# Patient Record
Sex: Male | Born: 1935 | Race: White | Hispanic: No | Marital: Married | Smoking: Never smoker
Health system: Southern US, Community
[De-identification: ages and names within clinical notes are randomized; demographics above are authoritative.]

## PROBLEM LIST (undated history)

## (undated) DIAGNOSIS — I10 Essential (primary) hypertension: Secondary | ICD-10-CM

## (undated) DIAGNOSIS — K225 Diverticulum of esophagus, acquired: Secondary | ICD-10-CM

## (undated) DIAGNOSIS — N200 Calculus of kidney: Secondary | ICD-10-CM

## (undated) DIAGNOSIS — R011 Cardiac murmur, unspecified: Secondary | ICD-10-CM

## (undated) DIAGNOSIS — C9000 Multiple myeloma not having achieved remission: Secondary | ICD-10-CM

## (undated) DIAGNOSIS — K5900 Constipation, unspecified: Secondary | ICD-10-CM

## (undated) DIAGNOSIS — K219 Gastro-esophageal reflux disease without esophagitis: Secondary | ICD-10-CM

## (undated) DIAGNOSIS — H9190 Unspecified hearing loss, unspecified ear: Secondary | ICD-10-CM

## (undated) DIAGNOSIS — I219 Acute myocardial infarction, unspecified: Secondary | ICD-10-CM

## (undated) HISTORY — PX: CARDIAC CATHETERIZATION: SHX172

## (undated) HISTORY — PX: LITHOTRIPSY: SUR834

## (undated) HISTORY — PX: HERNIA REPAIR: SHX51

## (undated) HISTORY — PX: CYSTOSCOPY W/ URETERAL STENT PLACEMENT: SHX1429

## (undated) HISTORY — PX: COLONOSCOPY W/ POLYPECTOMY: SHX1380

## (undated) HISTORY — PX: CERVICAL FUSION: SHX112

---

## 2002-12-02 DIAGNOSIS — I219 Acute myocardial infarction, unspecified: Secondary | ICD-10-CM

## 2002-12-02 HISTORY — DX: Acute myocardial infarction, unspecified: I21.9

## 2004-12-29 ENCOUNTER — Inpatient Hospital Stay (HOSPITAL_COMMUNITY): Admission: RE | Admit: 2004-12-29 | Discharge: 2004-12-30 | Payer: Self-pay | Admitting: Neurosurgery

## 2005-01-01 HISTORY — PX: TRANSURETHRAL RESECTION OF PROSTATE: SHX73

## 2005-02-06 ENCOUNTER — Encounter: Admission: RE | Admit: 2005-02-06 | Discharge: 2005-02-06 | Payer: Self-pay | Admitting: Neurosurgery

## 2007-03-03 IMAGING — CR DG CHEST 2V
2 series · 2 of 2 positions shown · non-contrast
Comparison: None.

CLINICAL DATA: Pre-admit for HNP.
 CHEST - 2 VIEW:

[view not recorded (1 of 2)]
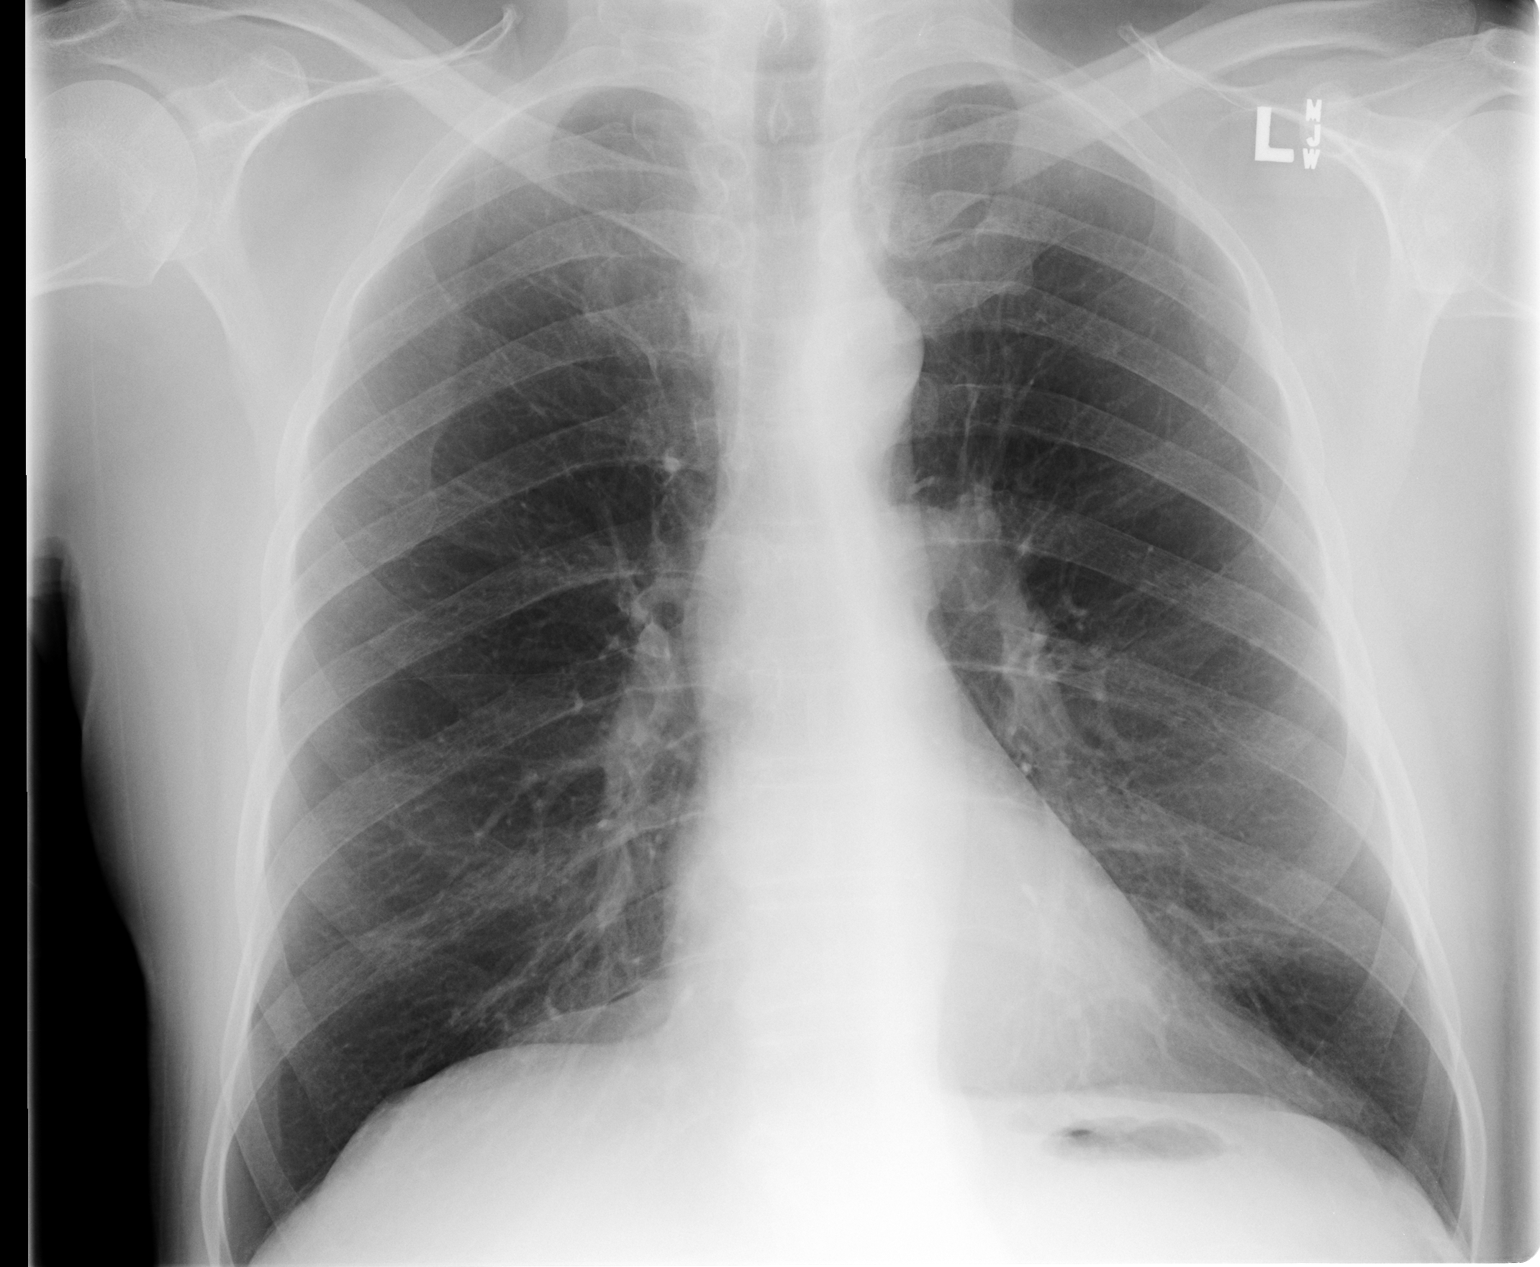

[view not recorded (2 of 2)]
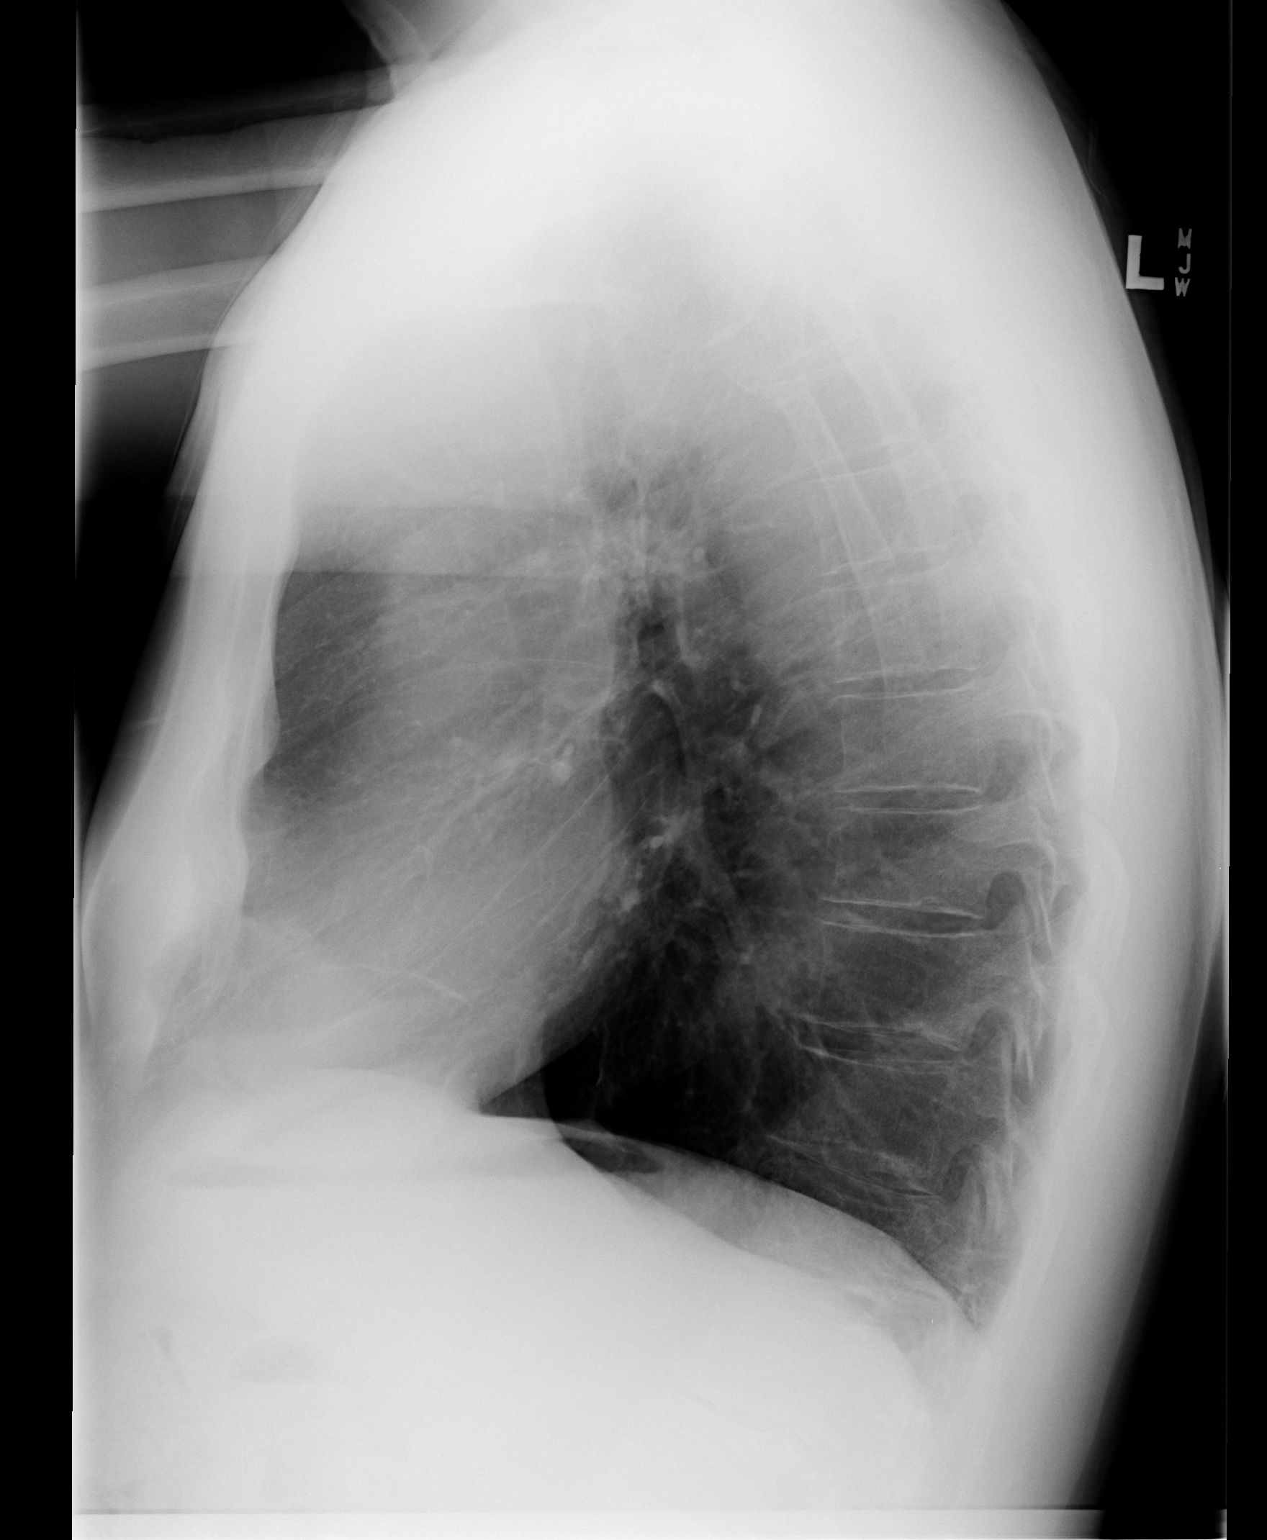

[2 of 2 positions shown; findings below may reference images not displayed]

FINDINGS: Midline trachea.  Heart size normal.  Mediastinal contours unremarkable.  There is a 3 to 5 mm density projecting between the 4th and 5th posterior left ribs.  There may be a second similar millimetric density over the posterior 7th left rib.  There is no pleural fluid.  The right lung is clear.
IMPRESSION: 1.  No acute cardiopulmonary disease.  
 2.  One and possible two tiny nodules over the left lung.  Given their well-circumscribed nature and their appearance on plain film, these most likely represent small granulomas.  These could be followed with plain film in 6 to 9 months to confirm stability if desired.

## 2008-08-09 HISTORY — PX: ESOPHAGOGASTRODUODENOSCOPY: SHX5428

## 2013-03-25 ENCOUNTER — Other Ambulatory Visit: Payer: Self-pay | Admitting: Neurosurgery

## 2013-04-06 ENCOUNTER — Encounter (HOSPITAL_COMMUNITY): Payer: Self-pay | Admitting: Pharmacy Technician

## 2013-04-10 ENCOUNTER — Encounter (HOSPITAL_COMMUNITY)
Admission: RE | Admit: 2013-04-10 | Discharge: 2013-04-10 | Disposition: A | Discharge status: Home / Self Care | Payer: Medicare Other | Source: Ambulatory Visit | Attending: Neurosurgery | Admitting: Neurosurgery

## 2013-04-10 ENCOUNTER — Other Ambulatory Visit: Payer: Self-pay | Admitting: Neurosurgery

## 2013-04-10 ENCOUNTER — Encounter (HOSPITAL_COMMUNITY): Payer: Self-pay

## 2013-04-10 DIAGNOSIS — Z01812 Encounter for preprocedural laboratory examination: Secondary | ICD-10-CM | POA: Insufficient documentation

## 2013-04-10 HISTORY — DX: Unspecified hearing loss, unspecified ear: H91.90

## 2013-04-10 HISTORY — DX: Calculus of kidney: N20.0

## 2013-04-10 HISTORY — DX: Multiple myeloma not having achieved remission: C90.00

## 2013-04-10 HISTORY — DX: Diverticulum of esophagus, acquired: K22.5

## 2013-04-10 HISTORY — DX: Cardiac murmur, unspecified: R01.1

## 2013-04-10 HISTORY — DX: Constipation, unspecified: K59.00

## 2013-04-10 HISTORY — DX: Essential (primary) hypertension: I10

## 2013-04-10 HISTORY — DX: Gastro-esophageal reflux disease without esophagitis: K21.9

## 2013-04-10 HISTORY — DX: Acute myocardial infarction, unspecified: I21.9

## 2013-04-10 LAB — CBC
HCT: 38.6 % — ABNORMAL LOW (ref 39.0–52.0)
Hemoglobin: 13.4 g/dL (ref 13.0–17.0)
MCH: 31.8 pg (ref 26.0–34.0)
MCHC: 34.7 g/dL (ref 30.0–36.0)
MCV: 91.5 fL (ref 78.0–100.0)
Platelets: 141 10*3/uL — ABNORMAL LOW (ref 150–400)
RBC: 4.22 MIL/uL (ref 4.22–5.81)
RDW: 12.3 % (ref 11.5–15.5)
WBC: 5.3 10*3/uL (ref 4.0–10.5)

## 2013-04-10 LAB — SURGICAL PCR SCREEN
MRSA, PCR: NEGATIVE
Staphylococcus aureus: NEGATIVE

## 2013-04-10 LAB — BASIC METABOLIC PANEL
BUN: 14 mg/dL (ref 6–23)
CO2: 22 mEq/L (ref 19–32)
Calcium: 9.3 mg/dL (ref 8.4–10.5)
Chloride: 101 mEq/L (ref 96–112)
Creatinine, Ser: 0.95 mg/dL (ref 0.50–1.35)
GFR calc Af Amer: 89 mL/min — ABNORMAL LOW (ref >90)
GFR, EST NON AFRICAN AMERICAN: 77 mL/min — AB (ref >90)
GLUCOSE: 125 mg/dL — AB (ref 70–99)
POTASSIUM: 4.2 meq/L (ref 3.7–5.3)
Sodium: 139 mEq/L (ref 137–147)

## 2013-04-10 NOTE — Pre-Procedure Instructions (Signed)
Mario Keith  04/10/2013   Your procedure is scheduled on:  Monday, April 20.  Report to [facility], Main Entrance [name]" at 9:55AM.  Call this number if you have problems the morning of surgery: [phone]   Remember:   Do not eat food or drink liquids after midnight  Sunday, April 19.   Take these medicines the morning of surgery with A SIP OF WATER: gabapentin (NEURONTIN), metoprolol succinate (TOPROL-XL), pantoprazole (PROTONIX).              May take Pain medication.               Stop taking Herbal medications and Vitamins, NSAIDS (Advil (Ibuprofen, Aleve (Naproxen), any aspirin or Ibuprofen products (BC or Goody Powders, Cold or sinus medications with Aspirin or Ibuprofen as an ingredient.    Do not wear jewelry, make-up or nail polish.  Do not wear lotions, powders, or perfumes             Men may shave face and neck.  Do not bring valuables to the hospital.  [facility] is not responsible for any belongings or valuables.               Contacts, dentures or bridgework may not be worn into surgery.  Leave suitcase in the car. After surgery it may be brought to your room.  For patients admitted to the hospital, discharge time is determined by your treatment team.                 Special Instructions: Review  [place] - Preparing For Surgery.   Please read over the following fact sheets that you were given: Pain Booklet, Coughing and Deep Breathing, Blood Transfusion Information and Surgical Site Infection Prevention

## 2013-04-10 NOTE — Progress Notes (Signed)
Patient instructed to  Stop Plavix 5 days prior to surgery, by Dr Saintclair Halsted.. I faxed a request to [facility] for a copy of sleep study.  I faxed a request to [facility] for a copy of echo.

## 2013-04-17 NOTE — Progress Notes (Signed)
Patient notified of new arrival time of 11:45 verbalized understanding

## 2013-04-20 ENCOUNTER — Encounter (HOSPITAL_COMMUNITY): Payer: Self-pay | Admitting: Certified Registered"

## 2013-04-20 ENCOUNTER — Inpatient Hospital Stay (HOSPITAL_COMMUNITY): Payer: Medicare Other

## 2013-04-20 ENCOUNTER — Inpatient Hospital Stay (HOSPITAL_COMMUNITY)
Admission: RE | Admit: 2013-04-20 | Discharge: 2013-04-22 | DRG: 473 | Disposition: A | Discharge status: Home / Self Care | Payer: Medicare Other | Source: Ambulatory Visit | Attending: Neurosurgery | Admitting: Neurosurgery

## 2013-04-20 ENCOUNTER — Inpatient Hospital Stay (HOSPITAL_COMMUNITY): Payer: Medicare Other | Admitting: Certified Registered"

## 2013-04-20 ENCOUNTER — Encounter (HOSPITAL_COMMUNITY): Payer: Medicare Other | Admitting: Certified Registered"

## 2013-04-20 ENCOUNTER — Encounter (HOSPITAL_COMMUNITY)
Admission: RE | Disposition: A | Discharge status: Home / Self Care | Payer: Self-pay | Source: Ambulatory Visit | Attending: Neurosurgery

## 2013-04-20 DIAGNOSIS — K219 Gastro-esophageal reflux disease without esophagitis: Secondary | ICD-10-CM | POA: Diagnosis present

## 2013-04-20 DIAGNOSIS — I252 Old myocardial infarction: Secondary | ICD-10-CM

## 2013-04-20 DIAGNOSIS — M4802 Spinal stenosis, cervical region: Secondary | ICD-10-CM | POA: Diagnosis present

## 2013-04-20 DIAGNOSIS — M47812 Spondylosis without myelopathy or radiculopathy, cervical region: Secondary | ICD-10-CM | POA: Diagnosis present

## 2013-04-20 DIAGNOSIS — Z7982 Long term (current) use of aspirin: Secondary | ICD-10-CM

## 2013-04-20 DIAGNOSIS — I2510 Atherosclerotic heart disease of native coronary artery without angina pectoris: Secondary | ICD-10-CM | POA: Diagnosis present

## 2013-04-20 DIAGNOSIS — Z79899 Other long term (current) drug therapy: Secondary | ICD-10-CM

## 2013-04-20 DIAGNOSIS — M4804 Spinal stenosis, thoracic region: Principal | ICD-10-CM | POA: Diagnosis present

## 2013-04-20 HISTORY — PX: POSTERIOR CERVICAL FUSION/FORAMINOTOMY: SHX5038

## 2013-04-20 LAB — TYPE AND SCREEN
ABO/RH(D): A POS
Antibody Screen: NEGATIVE

## 2013-04-20 LAB — ABO/RH: ABO/RH(D): A POS

## 2013-04-20 SURGERY — POSTERIOR CERVICAL FUSION/FORAMINOTOMY LEVEL 1
Anesthesia: General

## 2013-04-20 MED ORDER — ONDANSETRON HCL 4 MG/2ML IJ SOLN
INTRAMUSCULAR | Status: DC | PRN
Start: 2013-04-20 — End: 2013-04-20
  Administered 2013-04-20: 4 mg via INTRAVENOUS

## 2013-04-20 MED ORDER — ACETAMINOPHEN 650 MG RE SUPP: 650.0000 mg | RECTAL | Status: DC | PRN

## 2013-04-20 MED ORDER — MIDAZOLAM HCL 2 MG/2ML IJ SOLN
INTRAMUSCULAR | Status: AC
  Filled 2013-04-20: qty 2

## 2013-04-20 MED ORDER — PANTOPRAZOLE SODIUM 40 MG PO TBEC
40.0000 mg | DELAYED_RELEASE_TABLET | Freq: Two times a day (BID) | ORAL | Status: DC
  Administered 2013-04-20 – 2013-04-22 (×4): 40 mg via ORAL
  Filled 2013-04-20 (×3): qty 1

## 2013-04-20 MED ORDER — OXYMETAZOLINE HCL 0.05 % NA SOLN
1.0000 | Freq: Every day | NASAL | Status: DC
  Administered 2013-04-20 – 2013-04-21 (×2): 1 via NASAL
  Filled 2013-04-20: qty 15

## 2013-04-20 MED ORDER — THROMBIN 20000 UNITS EX SOLR
CUTANEOUS | Status: DC | PRN
  Administered 2013-04-20: 12:00:00 via TOPICAL

## 2013-04-20 MED ORDER — CLOPIDOGREL BISULFATE 75 MG PO TABS
75.0000 mg | ORAL_TABLET | Freq: Every day | ORAL | Status: DC
  Administered 2013-04-21 – 2013-04-22 (×2): 75 mg via ORAL
  Filled 2013-04-20 (×3): qty 1

## 2013-04-20 MED ORDER — ROCURONIUM BROMIDE 50 MG/5ML IV SOLN
INTRAVENOUS | Status: AC
  Filled 2013-04-20: qty 1

## 2013-04-20 MED ORDER — METOPROLOL SUCCINATE ER 100 MG PO TB24
100.0000 mg | ORAL_TABLET | Freq: Every day | ORAL | Status: DC
  Administered 2013-04-20 – 2013-04-22 (×3): 100 mg via ORAL
  Filled 2013-04-20 (×3): qty 1

## 2013-04-20 MED ORDER — PHENOL 1.4 % MT LIQD: 1.0000 | OROMUCOSAL | Status: DC | PRN

## 2013-04-20 MED ORDER — OXYCODONE HCL 5 MG PO TABS
5.0000 mg | ORAL_TABLET | Freq: Once | ORAL | Status: AC | PRN
  Administered 2013-04-20: 5 mg via ORAL

## 2013-04-20 MED ORDER — SODIUM CHLORIDE 0.9 % IV SOLN: 250.0000 mL | INTRAVENOUS | Status: DC

## 2013-04-20 MED ORDER — SODIUM CHLORIDE 0.9 % IJ SOLN
3.0000 mL | Freq: Two times a day (BID) | INTRAMUSCULAR | Status: DC
  Administered 2013-04-20: 3 mL via INTRAVENOUS

## 2013-04-20 MED ORDER — LACTATED RINGERS IV SOLN
INTRAVENOUS | Status: DC | PRN
  Administered 2013-04-20 (×2): via INTRAVENOUS

## 2013-04-20 MED ORDER — 0.9 % SODIUM CHLORIDE (POUR BTL) OPTIME
TOPICAL | Status: DC | PRN
  Administered 2013-04-20: 1000 mL

## 2013-04-20 MED ORDER — DEXAMETHASONE SODIUM PHOSPHATE 10 MG/ML IJ SOLN
INTRAMUSCULAR | Status: AC
  Filled 2013-04-20: qty 1

## 2013-04-20 MED ORDER — ADULT MULTIVITAMIN W/MINERALS CH
1.0000 | ORAL_TABLET | Freq: Every day | ORAL | Status: DC
  Administered 2013-04-21 – 2013-04-22 (×2): 1 via ORAL
  Filled 2013-04-20 (×2): qty 1

## 2013-04-20 MED ORDER — SODIUM CHLORIDE 0.9 % IR SOLN
Status: DC | PRN
  Administered 2013-04-20: 12:00:00

## 2013-04-20 MED ORDER — TRAMADOL HCL 50 MG PO TABS
50.0000 mg | ORAL_TABLET | Freq: Three times a day (TID) | ORAL | Status: DC
  Administered 2013-04-20 – 2013-04-22 (×4): 50 mg via ORAL
  Filled 2013-04-20 (×4): qty 1

## 2013-04-20 MED ORDER — SIMVASTATIN 40 MG PO TABS
40.0000 mg | ORAL_TABLET | Freq: Every day | ORAL | Status: DC
  Administered 2013-04-20 – 2013-04-22 (×3): 40 mg via ORAL
  Filled 2013-04-20 (×3): qty 1

## 2013-04-20 MED ORDER — SODIUM CHLORIDE 0.9 % IJ SOLN: 3.0000 mL | INTRAMUSCULAR | Status: DC | PRN

## 2013-04-20 MED ORDER — DOCUSATE SODIUM 100 MG PO CAPS
100.0000 mg | ORAL_CAPSULE | Freq: Two times a day (BID) | ORAL | Status: DC
  Administered 2013-04-20 – 2013-04-22 (×4): 100 mg via ORAL
  Filled 2013-04-20 (×5): qty 1

## 2013-04-20 MED ORDER — ACETAMINOPHEN 500 MG PO TABS
500.0000 mg | ORAL_TABLET | Freq: Three times a day (TID) | ORAL | Status: DC
  Administered 2013-04-20 – 2013-04-22 (×4): 500 mg via ORAL
  Filled 2013-04-20 (×7): qty 1

## 2013-04-20 MED ORDER — PROPOFOL 10 MG/ML IV BOLUS
INTRAVENOUS | Status: AC
  Filled 2013-04-20: qty 20

## 2013-04-20 MED ORDER — HYDROMORPHONE HCL PF 1 MG/ML IJ SOLN
INTRAMUSCULAR | Status: AC
  Filled 2013-04-20: qty 1

## 2013-04-20 MED ORDER — CEFAZOLIN SODIUM 1-5 GM-% IV SOLN
1.0000 g | Freq: Three times a day (TID) | INTRAVENOUS | Status: AC
  Administered 2013-04-20 – 2013-04-21 (×2): 1 g via INTRAVENOUS
  Filled 2013-04-20 (×2): qty 50

## 2013-04-20 MED ORDER — GLYCOPYRROLATE 0.2 MG/ML IJ SOLN
INTRAMUSCULAR | Status: DC | PRN
  Administered 2013-04-20: 0.2 mg via INTRAVENOUS
  Administered 2013-04-20: .8 mg via INTRAVENOUS

## 2013-04-20 MED ORDER — GABAPENTIN 600 MG PO TABS
600.0000 mg | ORAL_TABLET | Freq: Three times a day (TID) | ORAL | Status: DC
  Administered 2013-04-20 – 2013-04-22 (×6): 600 mg via ORAL
  Filled 2013-04-20 (×8): qty 1

## 2013-04-20 MED ORDER — MENTHOL 3 MG MT LOZG
1.0000 | LOZENGE | OROMUCOSAL | Status: DC | PRN
  Filled 2013-04-20: qty 9

## 2013-04-20 MED ORDER — FENTANYL CITRATE 0.05 MG/ML IJ SOLN
INTRAMUSCULAR | Status: AC
  Filled 2013-04-20: qty 5

## 2013-04-20 MED ORDER — FENTANYL CITRATE 0.05 MG/ML IJ SOLN
INTRAMUSCULAR | Status: DC | PRN
  Administered 2013-04-20 (×2): 50 ug via INTRAVENOUS
  Administered 2013-04-20: 100 ug via INTRAVENOUS

## 2013-04-20 MED ORDER — OXYCODONE-ACETAMINOPHEN 5-325 MG PO TABS
1.0000 | ORAL_TABLET | ORAL | Status: DC | PRN
Start: 2013-04-20 — End: 2013-04-22
  Administered 2013-04-21: 1 via ORAL
  Administered 2013-04-22: 2 via ORAL
  Filled 2013-04-20: qty 1
  Filled 2013-04-20: qty 2

## 2013-04-20 MED ORDER — ASPIRIN EC 81 MG PO TBEC
81.0000 mg | DELAYED_RELEASE_TABLET | Freq: Every day | ORAL | Status: DC
  Administered 2013-04-20 – 2013-04-22 (×3): 81 mg via ORAL
  Filled 2013-04-20 (×3): qty 1

## 2013-04-20 MED ORDER — PHENYLEPHRINE HCL 10 MG/ML IJ SOLN
10.0000 mg | INTRAVENOUS | Status: DC | PRN
  Administered 2013-04-20: 15 ug/min via INTRAVENOUS

## 2013-04-20 MED ORDER — DEXAMETHASONE SODIUM PHOSPHATE 10 MG/ML IJ SOLN
INTRAMUSCULAR | Status: DC | PRN
  Administered 2013-04-20: 10 mg via INTRAVENOUS

## 2013-04-20 MED ORDER — MIDAZOLAM HCL 5 MG/5ML IJ SOLN
INTRAMUSCULAR | Status: DC | PRN
  Administered 2013-04-20: 1 mg via INTRAVENOUS

## 2013-04-20 MED ORDER — PROMETHAZINE HCL 25 MG/ML IJ SOLN: 6.2500 mg | INTRAMUSCULAR | Status: DC | PRN

## 2013-04-20 MED ORDER — OXYCODONE HCL 5 MG PO TABS
ORAL_TABLET | ORAL | Status: AC
  Filled 2013-04-20: qty 1

## 2013-04-20 MED ORDER — LACTATED RINGERS IV SOLN
INTRAVENOUS | Status: DC
  Administered 2013-04-20: 10:00:00 via INTRAVENOUS

## 2013-04-20 MED ORDER — NEOSTIGMINE METHYLSULFATE 1 MG/ML IJ SOLN
INTRAMUSCULAR | Status: DC | PRN
  Administered 2013-04-20: 5 mg via INTRAVENOUS

## 2013-04-20 MED ORDER — PROPOFOL 10 MG/ML IV BOLUS
INTRAVENOUS | Status: DC | PRN
  Administered 2013-04-20: 170 mg via INTRAVENOUS

## 2013-04-20 MED ORDER — OXYCODONE HCL 5 MG/5ML PO SOLN: 5.0000 mg | Freq: Once | ORAL | Status: AC | PRN

## 2013-04-20 MED ORDER — LIDOCAINE HCL (CARDIAC) 20 MG/ML IV SOLN
INTRAVENOUS | Status: DC | PRN
  Administered 2013-04-20: 80 mg via INTRAVENOUS

## 2013-04-20 MED ORDER — PHENYLEPHRINE 40 MCG/ML (10ML) SYRINGE FOR IV PUSH (FOR BLOOD PRESSURE SUPPORT)
PREFILLED_SYRINGE | INTRAVENOUS | Status: AC
  Filled 2013-04-20: qty 10

## 2013-04-20 MED ORDER — THROMBIN 5000 UNITS EX SOLR
OROMUCOSAL | Status: DC | PRN
  Administered 2013-04-20: 14:00:00 via TOPICAL

## 2013-04-20 MED ORDER — CYCLOBENZAPRINE HCL 10 MG PO TABS
ORAL_TABLET | ORAL | Status: AC
  Filled 2013-04-20: qty 1

## 2013-04-20 MED ORDER — ONDANSETRON HCL 4 MG/2ML IJ SOLN: 4.0000 mg | INTRAMUSCULAR | Status: DC | PRN

## 2013-04-20 MED ORDER — ACETAMINOPHEN 325 MG PO TABS
650.0000 mg | ORAL_TABLET | ORAL | Status: DC | PRN
  Administered 2013-04-21: 650 mg via ORAL
  Filled 2013-04-20: qty 2

## 2013-04-20 MED ORDER — LIDOCAINE HCL (CARDIAC) 20 MG/ML IV SOLN
INTRAVENOUS | Status: AC
  Filled 2013-04-20: qty 5

## 2013-04-20 MED ORDER — CEFAZOLIN SODIUM-DEXTROSE 2-3 GM-% IV SOLR
INTRAVENOUS | Status: AC
  Filled 2013-04-20: qty 50

## 2013-04-20 MED ORDER — CEFAZOLIN SODIUM-DEXTROSE 2-3 GM-% IV SOLR
INTRAVENOUS | Status: DC | PRN
  Administered 2013-04-20: 2 g via INTRAVENOUS

## 2013-04-20 MED ORDER — LIDOCAINE-EPINEPHRINE 1 %-1:100000 IJ SOLN
INTRAMUSCULAR | Status: DC | PRN
Start: 2013-04-20 — End: 2013-04-20
  Administered 2013-04-20: 5 mL

## 2013-04-20 MED ORDER — SUCCINYLCHOLINE CHLORIDE 20 MG/ML IJ SOLN
INTRAMUSCULAR | Status: AC
  Filled 2013-04-20: qty 1

## 2013-04-20 MED ORDER — CYCLOBENZAPRINE HCL 10 MG PO TABS
10.0000 mg | ORAL_TABLET | Freq: Three times a day (TID) | ORAL | Status: DC | PRN
Start: 2013-04-20 — End: 2013-04-22
  Administered 2013-04-20 – 2013-04-21 (×2): 10 mg via ORAL
  Filled 2013-04-20 (×2): qty 1

## 2013-04-20 MED ORDER — ARTIFICIAL TEARS OP OINT
TOPICAL_OINTMENT | OPHTHALMIC | Status: DC | PRN
  Administered 2013-04-20: 1 via OPHTHALMIC

## 2013-04-20 MED ORDER — HYDROMORPHONE HCL PF 1 MG/ML IJ SOLN
0.5000 mg | INTRAMUSCULAR | Status: DC | PRN
Start: 2013-04-20 — End: 2013-04-22

## 2013-04-20 MED ORDER — ROCURONIUM BROMIDE 100 MG/10ML IV SOLN
INTRAVENOUS | Status: DC | PRN
  Administered 2013-04-20 (×2): 10 mg via INTRAVENOUS
  Administered 2013-04-20: 50 mg via INTRAVENOUS

## 2013-04-20 MED ORDER — HYDROMORPHONE HCL PF 1 MG/ML IJ SOLN
0.2500 mg | INTRAMUSCULAR | Status: DC | PRN
  Administered 2013-04-20 (×4): 0.5 mg via INTRAVENOUS

## 2013-04-20 MED ORDER — POLYETHYLENE GLYCOL 3350 17 G PO PACK: 17.0000 g | PACK | Freq: Every day | ORAL | Status: DC | PRN

## 2013-04-20 SURGICAL SUPPLY — 74 items
BAG DECANTER FOR FLEXI CONT (MISCELLANEOUS) ×3 IMPLANT
BENZOIN TINCTURE PRP APPL 2/3 (GAUZE/BANDAGES/DRESSINGS) ×3 IMPLANT
BLADE SURG 11 STRL SS (BLADE) ×3 IMPLANT
BLADE SURG ROTATE 9660 (MISCELLANEOUS) ×3 IMPLANT
BUR MATCHSTICK NEURO 3.0 LAGG (BURR) ×3 IMPLANT
CANISTER SUCT 3000ML (MISCELLANEOUS) ×3 IMPLANT
CAP ELLIPSE LOCKING (Cap) ×6 IMPLANT
CLOSURE WOUND 1/2 X4 (GAUZE/BANDAGES/DRESSINGS) ×1
CONT SPEC 4OZ CLIKSEAL STRL BL (MISCELLANEOUS) ×3 IMPLANT
DECANTER SPIKE VIAL GLASS SM (MISCELLANEOUS) ×3 IMPLANT
DERMABOND ADVANCED (GAUZE/BANDAGES/DRESSINGS) ×2
DERMABOND ADVANCED .7 DNX12 (GAUZE/BANDAGES/DRESSINGS) ×1 IMPLANT
DRAPE C-ARM 42X72 X-RAY (DRAPES) ×9 IMPLANT
DRAPE LAPAROTOMY 100X72 PEDS (DRAPES) ×3 IMPLANT
DRAPE MICROSCOPE LEICA (MISCELLANEOUS) ×3 IMPLANT
DRAPE MICROSCOPE ZEISS OPMI (DRAPES) IMPLANT
DRAPE POUCH INSTRU U-SHP 10X18 (DRAPES) ×3 IMPLANT
DRAPE SURG 17X23 STRL (DRAPES) IMPLANT
DRSG OPSITE 4X5.5 SM (GAUZE/BANDAGES/DRESSINGS) ×3 IMPLANT
DRSG OPSITE POSTOP 4X6 (GAUZE/BANDAGES/DRESSINGS) ×3 IMPLANT
DURAPREP 26ML APPLICATOR (WOUND CARE) ×6 IMPLANT
ELECT REM PT RETURN 9FT ADLT (ELECTROSURGICAL) ×3
ELECTRODE REM PT RTRN 9FT ADLT (ELECTROSURGICAL) ×1 IMPLANT
EVACUATOR 1/8 PVC DRAIN (DRAIN) ×3 IMPLANT
GAUZE SPONGE 4X4 16PLY XRAY LF (GAUZE/BANDAGES/DRESSINGS) IMPLANT
GLOVE BIO SURGEON STRL SZ8 (GLOVE) ×6 IMPLANT
GLOVE BIOGEL PI IND STRL 7.5 (GLOVE) ×1 IMPLANT
GLOVE BIOGEL PI IND STRL 8 (GLOVE) ×1 IMPLANT
GLOVE BIOGEL PI INDICATOR 7.5 (GLOVE) ×2
GLOVE BIOGEL PI INDICATOR 8 (GLOVE) ×2
GLOVE ECLIPSE 7.5 STRL STRAW (GLOVE) ×6 IMPLANT
GLOVE ECLIPSE 8.5 STRL (GLOVE) ×3 IMPLANT
GLOVE EXAM NITRILE LRG STRL (GLOVE) IMPLANT
GLOVE EXAM NITRILE MD LF STRL (GLOVE) ×3 IMPLANT
GLOVE EXAM NITRILE XL STR (GLOVE) IMPLANT
GLOVE EXAM NITRILE XS STR PU (GLOVE) IMPLANT
GLOVE INDICATOR 8.5 STRL (GLOVE) ×6 IMPLANT
GLOVE SURG SS PI 7.0 STRL IVOR (GLOVE) ×6 IMPLANT
GOWN BRE IMP SLV AUR LG STRL (GOWN DISPOSABLE) IMPLANT
GOWN BRE IMP SLV AUR XL STRL (GOWN DISPOSABLE) IMPLANT
GOWN STRL REIN 2XL LVL4 (GOWN DISPOSABLE) IMPLANT
GOWN STRL REUS W/ TWL XL LVL3 (GOWN DISPOSABLE) ×3 IMPLANT
GOWN STRL REUS W/TWL 2XL LVL3 (GOWN DISPOSABLE) ×6 IMPLANT
GOWN STRL REUS W/TWL XL LVL3 (GOWN DISPOSABLE) ×6
HEMOSTAT POWDER SURGIFOAM 1G (HEMOSTASIS) ×3 IMPLANT
KIT BASIN OR (CUSTOM PROCEDURE TRAY) ×3 IMPLANT
KIT ROOM TURNOVER OR (KITS) ×3 IMPLANT
MARKER SKIN DUAL TIP RULER LAB (MISCELLANEOUS) ×3 IMPLANT
NEEDLE HYPO 25X1 1.5 SAFETY (NEEDLE) ×3 IMPLANT
NEEDLE SPNL 20GX3.5 QUINCKE YW (NEEDLE) ×3 IMPLANT
NS IRRIG 1000ML POUR BTL (IV SOLUTION) ×3 IMPLANT
PACK LAMINECTOMY NEURO (CUSTOM PROCEDURE TRAY) ×3 IMPLANT
PAD ARMBOARD 7.5X6 YLW CONV (MISCELLANEOUS) ×9 IMPLANT
PIN MAYFIELD SKULL DISP (PIN) ×3 IMPLANT
PUTTY BONE DBX 5CC MIX (Putty) ×3 IMPLANT
ROD SPINE POST 3.5X80 (Rod) ×3 IMPLANT
RUBBERBAND STERILE (MISCELLANEOUS) ×6 IMPLANT
SCREW 3.5X16 (Screw) ×3 IMPLANT
SCREW 3.5X18MM (Screw) ×3 IMPLANT
SPONGE GAUZE 4X4 12PLY (GAUZE/BANDAGES/DRESSINGS) ×3 IMPLANT
SPONGE LAP 4X18 X RAY DECT (DISPOSABLE) IMPLANT
SPONGE SURGIFOAM ABS GEL 100 (HEMOSTASIS) ×3 IMPLANT
STRIP BIOACTIVE 5CC 25X50X4MM (Miscellaneous) ×3 IMPLANT
STRIP CLOSURE SKIN 1/2X4 (GAUZE/BANDAGES/DRESSINGS) ×2 IMPLANT
SUT ETHILON 4 0 PS 2 18 (SUTURE) IMPLANT
SUT VIC AB 0 CT1 18XCR BRD8 (SUTURE) ×1 IMPLANT
SUT VIC AB 0 CT1 8-18 (SUTURE) ×2
SUT VIC AB 2-0 CT1 18 (SUTURE) ×3 IMPLANT
SUT VICRYL 4-0 PS2 18IN ABS (SUTURE) ×3 IMPLANT
SYR 20ML ECCENTRIC (SYRINGE) ×3 IMPLANT
TOWEL OR 17X24 6PK STRL BLUE (TOWEL DISPOSABLE) ×3 IMPLANT
TOWEL OR 17X26 10 PK STRL BLUE (TOWEL DISPOSABLE) ×3 IMPLANT
TRAY FOLEY CATH 14FRSI W/METER (CATHETERS) IMPLANT
WATER STERILE IRR 1000ML POUR (IV SOLUTION) ×3 IMPLANT

## 2013-04-20 NOTE — Anesthesia Preprocedure Evaluation (Addendum)
Anesthesia Evaluation  Patient identified by MRN, date of birth, ID band Patient awake    Reviewed: Allergy & Precautions, H&P , NPO status , Patient's Chart, lab work & pertinent test results, reviewed documented beta blocker date and time   Airway Mallampati: II TM Distance: >3 FB Neck ROM: Full    Dental  (+) Teeth Intact   Pulmonary  breath sounds clear to auscultation  Pulmonary exam normal       Cardiovascular hypertension, Pt. on medications and Pt. on home beta blockers + CAD, + Past MI and + Cardiac Stents Rhythm:Regular Rate:Normal     Neuro/Psych    GI/Hepatic GERD-  Medicated and Controlled,  Endo/Other    Renal/GU Renal disease     Musculoskeletal   Abdominal Normal abdominal exam  (+)   Peds  Hematology   Anesthesia Other Findings   Reproductive/Obstetrics                         Anesthesia Physical Anesthesia Plan  ASA: III  Anesthesia Plan: General   Post-op Pain Management:    Induction: Intravenous  Airway Management Planned: Oral ETT  Additional Equipment:   Intra-op Plan:   Post-operative Plan: Extubation in OR  Informed Consent: I have reviewed the patients History and Physical, chart, labs and discussed the procedure including the risks, benefits and alternatives for the proposed anesthesia with the patient or authorized representative who has indicated his/her understanding and acceptance.   Dental advisory given  Plan Discussed with: CRNA, Anesthesiologist and Surgeon  Anesthesia Plan Comments:         Anesthesia Quick Evaluation

## 2013-04-20 NOTE — Transfer of Care (Signed)
Immediate Anesthesia Transfer of Care Note  Patient: Mario Keith  Procedure(s) Performed: Procedure(s) with comments: POSTERIOR CERVICAL FUSION/FORAMINOTOMY/LAMINOTOMY/DECOMPRESSION Cervical Seven/Thoracic One (N/A) - POSTERIOR CERVICAL FUSION/FORAMINOTOMY/LAMINOTOMY/DECOMPRESSION Cervical Seven/Thoracic One  Patient Location: PACU  Anesthesia Type:General  Level of Consciousness: awake, alert  and oriented  Airway & Oxygen Therapy: Patient Spontanous Breathing and Patient connected to face mask oxygen  Post-op Assessment: Report given to PACU RN  Post vital signs: Reviewed and stable  Complications: No apparent anesthesia complications

## 2013-04-20 NOTE — H&P (Signed)
Mario Keith is an 80 y.o. male.   Chief Complaint: Neck pain left arm pain HPI: Patient was a 80 year old some is a progress worsening neck and left arm pain rating down the last 3 fingers of his left hand consistent with a C8 radiculopathy. He had intermittently some weakness in his hand intrinsics. He failed all forms of conservative treatment. Workup revealed spondylosis at C7-T1 with slight motion on flexion extension films with severe foraminal stenosis the nerve root. All patient's symptomatology is been unilateral. I recommended laminotomy discectomy at C 7-T1 with lateral mass or pedicle screw fixation from C7-T1. I have extensively reviewed the risks and benefits of the operation the patient as well as perioperative course expectations about alternatives of surgery and he understands and agrees to proceed forward.  Past Medical History  Diagnosis Date  . Myocardial infarction 12/04  . Hypertension   . Heart murmur     years ago- not now  . Kidney stone   . GERD (gastroesophageal reflux disease)   . Constipation   . HOH (hard of hearing)   . Multiple myeloma     has never had to have treatment  . Zenker diverticulum     small    Past Surgical History  Procedure Laterality Date  . Lithotripsy    . Hernia repair Left   . Cardiac catheterization    . Colonoscopy w/ polypectomy      none in recent past- polpectomy  . Cervical fusion      with Cadavar bone  . Transurethral resection of prostate  2007  . Esophagogastroduodenoscopy  08,09,10  . Cystoscopy w/ ureteral stent placement      History reviewed. No pertinent family history. Social History:  reports that he has never smoked. He does not have any smokeless tobacco history on file. He reports that he does not drink alcohol or use illicit drugs.  Allergies: No Known Allergies  Medications Prior to Admission  Medication Sig Dispense Refill  . acetaminophen (TYLENOL) 500 MG chewable tablet Chew 1,000 mg by mouth 3  (three) times daily.      Marland Kitchen aspirin EC 81 MG tablet Take 81 mg by mouth daily.      . B Complex-C-Folic Acid (FOLBEE PLUS PO) Take 1 tablet by mouth daily.      . clopidogrel (PLAVIX) 75 MG tablet Take 75 mg by mouth daily with breakfast.      . Coenzyme Q10 (CO Q 10) 100 MG CAPS Take 100 mg by mouth daily.      Marland Kitchen gabapentin (NEURONTIN) 600 MG tablet Take 600 mg by mouth 3 (three) times daily.      . Glucosamine-Chondroitin (GLUCOSAMINE CHONDR COMPLEX PO) Take 1 tablet by mouth daily.      . metoprolol succinate (TOPROL-XL) 100 MG 24 hr tablet Take 100 mg by mouth daily. Take with or immediately following a meal.      . Multiple Vitamin (MULTIVITAMIN WITH MINERALS) TABS tablet Take 1 tablet by mouth daily.      Marland Kitchen oxymetazoline (AFRIN) 0.05 % nasal spray Place 1 spray into both nostrils at bedtime.       . pantoprazole (PROTONIX) 40 MG tablet Take 40 mg by mouth 2 (two) times daily.      . polyethylene glycol (MIRALAX / GLYCOLAX) packet Take 17 g by mouth daily as needed.      . simvastatin (ZOCOR) 40 MG tablet Take 40 mg by mouth daily.      . traMADol (  ULTRAM) 50 MG tablet Take 50 mg by mouth 3 (three) times daily.        Results for orders placed during the hospital encounter of 04/20/13 (from the past 48 hour(s))  TYPE AND SCREEN     Status: None   Collection Time    04/20/13 10:00 AM      Result Value Ref Range   ABO/RH(D) A POS     Antibody Screen NEG     Sample Expiration 04/23/2013     No results found.  Review of Systems  Constitutional: Negative.   Eyes: Negative.   Respiratory: Negative.   Cardiovascular: Negative.   Gastrointestinal: Negative.   Genitourinary: Negative.   Musculoskeletal: Positive for myalgias and neck pain.  Skin: Negative.   Neurological: Positive for tingling and sensory change.  Endo/Heme/Allergies: Negative.   Psychiatric/Behavioral: Negative.     Blood pressure 194/84, pulse 56, temperature 98 F (36.7 C), temperature source Oral, resp. rate  18, height _0  (1.88 m), weight 82.101 kg (181 lb), SpO2 100.00%. Physical Exam  Constitutional: He is oriented to person, place, and time. He appears well-developed and well-nourished.  HENT:  Head: Normocephalic.  Eyes: Pupils are equal, round, and reactive to light.  Neck: Normal range of motion.  Respiratory: Effort normal.  GI: Soft.  Neurological: He is alert and oriented to person, place, and time. He has normal strength.  Reflex Scores:      Tricep reflexes are 2+ on the right side and 2+ on the left side.      Bicep reflexes are 2+ on the right side and 2+ on the left side.      Brachioradialis reflexes are 2+ on the right side and 2+ on the left side.      Patellar reflexes are 2+ on the right side and 2+ on the left side.      Achilles reflexes are 2+ on the right side and 2+ on the left side. Strength is 5 out of 5 in his deltoids, biceps, triceps wrist flexion, wrist extension, and intrinsics.  Skin: Skin is warm and dry.     Assessment/Plan 80 year old presents for a C8 foraminotomies C7-T1 discectomy and posterior cervical fixation C7-T1  Elaina Hoops 04/20/2013, 11:22 AM

## 2013-04-20 NOTE — Op Note (Signed)
PReoperative diagnosis: C8 radiculopathy from cervical spondylosis with stenosis and instability at C7-T1  Postoperative diagnosis: Same  Procedure: Posterior cervical laminectomy and foraminotomies in the left C7-T1 with microdissection of the C8 nerve root microscopic foraminotomies  #2 posterior cervical fusion with globus ellipse pedicle screws at C7 and T1 and with 16 mm screws C7 18 mm T1  #3 posterior lateral arthrodesis C7-T1 using local autograft mixed with DBX and Kinex  Surgeon: Dominica Severin Johnjoseph Rolfe  Assistant: Kristeen Miss  Anesthesia: Gen.  EBL: Minimal  History of present illness: Patient is a 80 year old gentleman has had progressive worsening neck and left arm pain rate in last 2 fingers of his left axis with C8 nerve root pattern workup revealed a slight subluxation C7-T1 with severe foraminal stenosis in part from dorsal facet arthropathy and seated disc and marked foraminal stenosis. Due to patient's preoperative imaging showing some mild movement on flexion extension his progressive C8 radiculopathy and failure conservative treatment I recommended foraminotomy and decompression at C7-T1 and posterior lateral fusion with globus pedicle screws at C7-T1 unilaterally. I extensively reviewed the risks and benefits of the operation the patient as well as perioperative course and expectations of outcome alternatives surgery and he understood and agreed to proceed forward.  Operative procedure: Patient brought into the or was induced under general anesthesia positioned prone in pins the neck was slightly flexed the back side his neck was prepped and draped in routine sterile fashion. After infiltration 10 cc lidocaine with epi a midline incision was made and Bovie light cautery was used to take down the subcutaneous tissues and subperiosteal dissections care lamina of C7 and T1 as well as part of the lamina of C6. Interoperative x-ray confirmed it of case the C5-6 disc space so attention was  taken 2 disc interspace was below this. Laminotomy was begun with a minute Kerrison punch the facet complexes lateral mass was a markedly hypertrophied and there was some inflammatory granulation tissue consistent with also synovial cyst on the external aspect of the facet joint this is all bitten away laminotomy was begun aggressive abutting of the medial facet allowed to the case to marked hypertrophied ligament and a large spur coming off the inferior aspect of the C7 facet this is causing marked stenosis and thecal sac as well as proximal C8 nerve root this is all teased off of the proximal C8 nerve root the C7 and T1 pedicles were identified the C8 nerve root was skeletonized and decompressed in the foramen flush with pedicle. There is marked foraminal stenosis distally I track this down to 80 delivering easily accepted nerve hook and Rhoton dissector. Epi and the decompression attention second pedicle screw placement using fluoroscopy with limited visualization by direct in visualization of the C8 nerve root I placed pilot holes in the C7-T1 pedicle hand drill down to 14 mm placed a 16 mm screw after tapping the cortex and C7 18 mm and T1 all screws excellent purchase then a wound scope was irrigated and aggressive decortication was care lateral masses lamina at C7 and T1 the DBX local autograft kinex was then packed lateral to the screws and along the lateral masses the foramina were reinspected confirm patency no migration of graft material a rod was cut and inserted tapped it does tighten down the neck in place. The foramen was then reinspected confirm patency meticulous hemostasis was maintained Gelfoam was laid over the dura a medium neck drain was placed and the wounds closed in layers with interrupted Vicryl the  skin was) 4 subcuticular benzo and Steri-Strips were applied patient recovered in stable condition. At the end of case on it counts sponge counts were correct.

## 2013-04-20 NOTE — Anesthesia Postprocedure Evaluation (Signed)
  Anesthesia Post-op Note  Patient: Mario Keith  Procedure(s) Performed: Procedure(s) with comments: POSTERIOR CERVICAL FUSION/FORAMINOTOMY/LAMINOTOMY/DECOMPRESSION Cervical Seven/Thoracic One (N/A) - POSTERIOR CERVICAL FUSION/FORAMINOTOMY/LAMINOTOMY/DECOMPRESSION Cervical Seven/Thoracic One  Patient Location: PACU  Anesthesia Type:General  Level of Consciousness: awake and alert   Airway and Oxygen Therapy: Patient Spontanous Breathing  Post-op Pain: mild  Post-op Assessment: Post-op Vital signs reviewed  Post-op Vital Signs: stable  Last Vitals:  Filed Vitals:   04/20/13 1501  BP:   Pulse: 62  Temp: 36 C  Resp: 11    Complications: No apparent anesthesia complications

## 2013-04-21 ENCOUNTER — Encounter (HOSPITAL_COMMUNITY): Payer: Self-pay | Admitting: Neurosurgery

## 2013-04-21 MED ORDER — METHYLPREDNISOLONE 4 MG PO KIT
4.0000 mg | PACK | Freq: Three times a day (TID) | ORAL | Status: AC
  Administered 2013-04-22 (×3): 4 mg via ORAL

## 2013-04-21 MED ORDER — TAMSULOSIN HCL 0.4 MG PO CAPS
0.4000 mg | ORAL_CAPSULE | Freq: Every day | ORAL | Status: DC
  Administered 2013-04-21 – 2013-04-22 (×2): 0.4 mg via ORAL
  Filled 2013-04-21 (×2): qty 1

## 2013-04-21 MED ORDER — METHYLPREDNISOLONE 4 MG PO KIT: 4.0000 mg | PACK | Freq: Four times a day (QID) | ORAL | Status: DC

## 2013-04-21 MED ORDER — METHYLPREDNISOLONE 4 MG PO KIT: 8.0000 mg | PACK | Freq: Every evening | ORAL | Status: DC

## 2013-04-21 MED ORDER — METHYLPREDNISOLONE 4 MG PO KIT
8.0000 mg | PACK | Freq: Every morning | ORAL | Status: AC
  Administered 2013-04-21: 8 mg via ORAL
  Filled 2013-04-21: qty 21

## 2013-04-21 MED ORDER — METHYLPREDNISOLONE 4 MG PO KIT
4.0000 mg | PACK | ORAL | Status: AC
  Administered 2013-04-21: 4 mg via ORAL

## 2013-04-21 MED ORDER — METHYLPREDNISOLONE 4 MG PO KIT
8.0000 mg | PACK | Freq: Every evening | ORAL | Status: AC
  Administered 2013-04-21: 8 mg via ORAL

## 2013-04-21 MED ORDER — TIZANIDINE HCL 4 MG PO TABS: 4.0000 mg | ORAL_TABLET | Freq: Two times a day (BID) | ORAL | Status: AC

## 2013-04-21 NOTE — Progress Notes (Signed)
Subjective: Patient reports He still better no arm pain soreness in his neck he is having a little bit of urinary retention with the last post void residual being 600  Objective: Vital signs in last 24 hours: Temp:  [96.8 F (36 C)-98.6 F (37 C)] 98.3 F (36.8 C) (04/21 0401) Pulse Rate:  [56-84] 78 (04/21 0401) Resp:  [11-19] 18 (04/21 0401) BP: (113-195)/(63-89) 135/65 mmHg (04/21 0401) SpO2:  [92 %-100 %] 93 % (04/21 0401) Weight:  [82.101 kg (181 lb)] 82.101 kg (181 lb) (04/20 1009)  Intake/Output from previous day: 04/20 0701 - 04/21 0700 In: 1920 [P.O.:720; I.V.:1200] Out: 2841 [Urine:4202; Drains:95; Blood:150] Intake/Output this shift:    Neurologically intact wound clean and dry  Lab Results: No results found for this basename: WBC, HGB, HCT, PLT,  in the last 72 hours BMET No results found for this basename: NA, K, CL, CO2, GLUCOSE, BUN, CREATININE, CALCIUM,  in the last 72 hours  Studies/Results: Dg Cervical Spine 2-3 Views  04/20/2013   CLINICAL DATA:  Posterior fusion  EXAM: CERVICAL SPINE - 2-3 VIEW  COMPARISON:  01/09/2013  FINDINGS: C-arm images show posterior fusion at the C7-T1 level with unilateral lateral mass screws and posterior rod. There is no left right marking on the frontal view.  IMPRESSION: Unilateral posterior fusion at C7-T1, side not marked.   Electronically Signed   By: Nelson Chimes M.D.   On: 04/20/2013 15:38    Assessment/Plan: Continue watch his bladder throughout the morning start Flomax probable discharged this afternoon  LOS: 1 day     Elaina Hoops 04/21/2013, 7:21 AM

## 2013-04-21 NOTE — Discharge Instructions (Signed)

## 2013-04-21 NOTE — Plan of Care (Signed)
Problem: Consults Goal: Diagnosis - Spinal Surgery Outcome: Completed/Met Date Met:  04/21/13 Cervical Spine Fusion

## 2013-04-21 NOTE — Discharge Summary (Signed)
  Physician Discharge Summary  Patient ID: Mario Keith MRN: [id number] DOB/AGE: 1931/12/31 80 y.o.  Admit date: 04/20/2013 Discharge date: 04/21/2013  Admission Diagnoses: Cervical spondylosis and stenosis with left C8 radiculopathy with instability at C7-T1  Discharge Diagnoses: Same Active Problems:   Spinal stenosis in cervical region   Discharged Condition: good  Hospital Course: Patient underwent posterior cervical laminectomy decompression foraminotomies of the C7-T1 interspace of the C8 nerve root. Also underwent posterior cervical fusion from C7-T1 using pedicle screws from the globus ellipse lateral system .  Consults: Significant Diagnostic Studies: Treatments: Posterior cervical decompression fusion C7-T1 Discharge Exam: Blood pressure 135/65, pulse 78, temperature 98.3 F (36.8 C), temperature source Oral, resp. rate 18, height 6\' 2"  (1.88 m), weight 82.101 kg (181 lb), SpO2 93.00%. Strength out of 5 wound clean and dry  Disposition: Home     Medication List         acetaminophen 500 MG chewable tablet  Commonly known as:  TYLENOL  Chew 1,000 mg by mouth 3 (three) times daily.     aspirin EC 81 MG tablet  Take 81 mg by mouth daily.     clopidogrel 75 MG tablet  Commonly known as:  PLAVIX  Take 75 mg by mouth daily with breakfast.     Co Q 10 100 MG Caps  Take 100 mg by mouth daily.     FOLBEE PLUS PO  Take 1 tablet by mouth daily.     gabapentin 600 MG tablet  Commonly known as:  NEURONTIN  Take 600 mg by mouth 3 (three) times daily.     GLUCOSAMINE CHONDR COMPLEX PO  Take 1 tablet by mouth daily.     metoprolol succinate 100 MG 24 hr tablet  Commonly known as:  TOPROL-XL  Take 100 mg by mouth daily. Take with or immediately following a meal.     multivitamin with minerals Tabs tablet  Take 1 tablet by mouth daily.     oxymetazoline 0.05 % nasal spray  Commonly known as:  AFRIN  Place 1 spray into both nostrils at bedtime.     pantoprazole 40 MG tablet  Commonly known as:  PROTONIX  Take 40 mg by mouth 2 (two) times daily.     polyethylene glycol packet  Commonly known as:  MIRALAX / GLYCOLAX  Take 17 g by mouth daily as needed.     simvastatin 40 MG tablet  Commonly known as:  ZOCOR  Take 40 mg by mouth daily.     tiZANidine 4 MG tablet  Commonly known as:  ZANAFLEX  Take 1 tablet (4 mg total) by mouth 2 (two) times daily.     traMADol 50 MG tablet  Commonly known as:  ULTRAM  Take 50 mg by mouth 3 (three) times daily.           Follow-up Information   Follow up with [facility], MD.   Specialty:  Neurosurgery   Contact information:   [address] [place] [id number] [phone]       Signed: Elaina Hoops 04/21/2013, 7:24 AM

## 2013-04-22 NOTE — Progress Notes (Signed)
Pt and wife given D/C instructions with verbal understanding of D/C. Pt was D/C'd home with Foley cath per MD order and will follow-up on Friday with urology. Pt sent home with dose pack per MD order. Pt's IV was removed prior to D/C. Pt D/C'd home via wheelchair @ 1800 per MD order. Pt is stable @ D/C and has no other needs. Holli Humbles, RN

## 2013-04-22 NOTE — Discharge Summary (Signed)
  Visit addendum to previous discharge summary: Patient was kept an additional day because of urinary retention. Ultimately he was stabilized on Medrol Dosepak and discharge with catheter placed. We will arrange followup with outpatient urology

## 2018-11-10 ENCOUNTER — Other Ambulatory Visit: Payer: Self-pay | Admitting: Urology

## 2018-11-10 ENCOUNTER — Other Ambulatory Visit (HOSPITAL_COMMUNITY): Payer: Self-pay | Admitting: Urology

## 2018-11-10 ENCOUNTER — Telehealth: Payer: Self-pay | Admitting: *Deleted

## 2018-11-10 DIAGNOSIS — R972 Elevated prostate specific antigen [PSA]: Secondary | ICD-10-CM

## 2018-11-17 ENCOUNTER — Ambulatory Visit (HOSPITAL_COMMUNITY): Payer: Medicare HMO

## 2018-11-21 ENCOUNTER — Ambulatory Visit (HOSPITAL_COMMUNITY)
Admission: RE | Admit: 2018-11-21 | Discharge: 2018-11-21 | Disposition: A | Discharge status: Home / Self Care | Payer: Medicare HMO | Source: Ambulatory Visit | Attending: Urology | Admitting: Urology

## 2018-11-21 ENCOUNTER — Other Ambulatory Visit: Payer: Self-pay

## 2018-11-21 DIAGNOSIS — R972 Elevated prostate specific antigen [PSA]: Secondary | ICD-10-CM | POA: Diagnosis present

## 2018-11-21 LAB — CREATININE, SERUM
Creatinine, Ser: 0.89 mg/dL (ref 0.61–1.24)
GFR calc Af Amer: >60 mL/min (ref >60)
GFR calc non Af Amer: >60 mL/min (ref >60)

## 2018-11-21 MED ORDER — GADOBUTROL 1 MMOL/ML IV SOLN
8.0000 mL | Freq: Once | INTRAVENOUS | Status: AC | PRN
  Administered 2018-11-21: 8 mL via INTRAVENOUS

## 2024-02-02 DEATH — deceased
